# Patient Record
Sex: Male | Born: 1960 | Race: White | Hispanic: No | Smoking: Former smoker
Health system: Southern US, Community
[De-identification: ages and names within clinical notes are randomized; demographics above are authoritative.]

## PROBLEM LIST (undated history)

## (undated) DIAGNOSIS — I10 Essential (primary) hypertension: Secondary | ICD-10-CM

## (undated) DIAGNOSIS — S270XXA Traumatic pneumothorax, initial encounter: Secondary | ICD-10-CM

## (undated) DIAGNOSIS — K5792 Diverticulitis of intestine, part unspecified, without perforation or abscess without bleeding: Secondary | ICD-10-CM

## (undated) DIAGNOSIS — S32029B Unspecified fracture of second lumbar vertebra, initial encounter for open fracture: Secondary | ICD-10-CM

## (undated) HISTORY — DX: Unspecified fracture of second lumbar vertebra, initial encounter for open fracture: S32.029B

## (undated) HISTORY — PX: LIPOMA EXCISION: SHX5283

## (undated) HISTORY — PX: BACK SURGERY: SHX140

## (undated) HISTORY — DX: Traumatic pneumothorax, initial encounter: S27.0XXA

---

## 1999-07-03 ENCOUNTER — Encounter: Payer: Self-pay | Admitting: Emergency Medicine

## 1999-07-03 ENCOUNTER — Emergency Department (HOSPITAL_COMMUNITY): Admission: EM | Admit: 1999-07-03 | Discharge: 1999-07-03 | Payer: Self-pay

## 1999-07-20 ENCOUNTER — Ambulatory Visit (HOSPITAL_BASED_OUTPATIENT_CLINIC_OR_DEPARTMENT_OTHER): Admission: RE | Admit: 1999-07-20 | Discharge: 1999-07-20 | Payer: Self-pay | Admitting: Orthopaedic Surgery

## 2004-04-27 ENCOUNTER — Ambulatory Visit (HOSPITAL_COMMUNITY): Admission: RE | Admit: 2004-04-27 | Discharge: 2004-04-27 | Payer: Self-pay | Admitting: Dermatology

## 2006-08-17 ENCOUNTER — Ambulatory Visit (HOSPITAL_COMMUNITY): Admission: RE | Admit: 2006-08-17 | Discharge: 2006-08-17 | Payer: Self-pay | Admitting: Dermatology

## 2009-01-10 HISTORY — PX: ANKLE SURGERY: SHX546

## 2009-01-10 HISTORY — PX: FOOT SURGERY: SHX648

## 2009-08-10 DIAGNOSIS — S270XXA Traumatic pneumothorax, initial encounter: Secondary | ICD-10-CM

## 2009-08-10 DIAGNOSIS — S32029B Unspecified fracture of second lumbar vertebra, initial encounter for open fracture: Secondary | ICD-10-CM

## 2009-08-10 HISTORY — DX: Traumatic pneumothorax, initial encounter: S27.0XXA

## 2009-08-10 HISTORY — DX: Unspecified fracture of second lumbar vertebra, initial encounter for open fracture: S32.029B

## 2009-09-18 ENCOUNTER — Encounter: Admission: RE | Admit: 2009-09-18 | Discharge: 2009-09-18 | Payer: Self-pay | Admitting: Orthopedic Surgery

## 2009-12-17 ENCOUNTER — Emergency Department (HOSPITAL_COMMUNITY): Admission: EM | Admit: 2009-12-17 | Discharge: 2009-09-27 | Payer: Self-pay | Admitting: Emergency Medicine

## 2010-02-16 ENCOUNTER — Other Ambulatory Visit: Payer: Self-pay | Admitting: General Surgery

## 2010-02-16 ENCOUNTER — Ambulatory Visit (HOSPITAL_COMMUNITY): Admit: 2010-02-16 | Payer: Self-pay | Admitting: General Surgery

## 2010-02-16 ENCOUNTER — Ambulatory Visit (HOSPITAL_COMMUNITY)
Admission: RE | Admit: 2010-02-16 | Discharge: 2010-02-16 | Disposition: A | Discharge status: Home / Self Care | Payer: PRIVATE HEALTH INSURANCE | Source: Ambulatory Visit | Attending: General Surgery | Admitting: General Surgery

## 2010-02-16 DIAGNOSIS — Z01812 Encounter for preprocedural laboratory examination: Secondary | ICD-10-CM | POA: Insufficient documentation

## 2010-02-16 DIAGNOSIS — D1739 Benign lipomatous neoplasm of skin and subcutaneous tissue of other sites: Secondary | ICD-10-CM | POA: Insufficient documentation

## 2010-02-16 DIAGNOSIS — Z01810 Encounter for preprocedural cardiovascular examination: Secondary | ICD-10-CM | POA: Insufficient documentation

## 2010-02-16 LAB — DIFFERENTIAL
Basophils Absolute: 0 10*3/uL (ref 0.0–0.1)
Basophils Relative: 0 % (ref 0–1)
Eosinophils Absolute: 0.1 10*3/uL (ref 0.0–0.7)
Eosinophils Relative: 1 % (ref 0–5)
Monocytes Absolute: 0.7 10*3/uL (ref 0.1–1.0)

## 2010-02-16 LAB — CBC
MCHC: 33 g/dL (ref 30.0–36.0)
Platelets: 260 10*3/uL (ref 150–400)
RDW: 13.6 % (ref 11.5–15.5)
WBC: 7.9 10*3/uL (ref 4.0–10.5)

## 2010-02-16 LAB — COMPREHENSIVE METABOLIC PANEL
ALT: 44 U/L (ref 0–53)
Albumin: 3.8 g/dL (ref 3.5–5.2)
Alkaline Phosphatase: 88 U/L (ref 39–117)
Calcium: 9.3 mg/dL (ref 8.4–10.5)
Potassium: 4.6 mEq/L (ref 3.5–5.1)
Sodium: 140 mEq/L (ref 135–145)
Total Protein: 7 g/dL (ref 6.0–8.3)

## 2010-02-16 LAB — SURGICAL PCR SCREEN: MRSA, PCR: NEGATIVE

## 2010-03-01 NOTE — Op Note (Signed)
NAMEEBRIMA, RANTA NO.:  [id number]  MEDICAL RECORD NO.:  [id number]           PATIENT TYPE:  O  LOCATION:  [facility]                         FACILITY:  [facility]  PHYSICIAN:  Mary Sella. Andrey Campanile, MD     DATE OF BIRTH:  09/15/1960  DATE OF PROCEDURE:  02/16/2010 DATE OF DISCHARGE:                              OPERATIVE REPORT   PREOPERATIVE DIAGNOSIS:  Right upper back subcutaneous mass.  POSTOPERATIVE DIAGNOSIS:  Right upper back lipoma.  PROCEDURE:  Excision of right upper back lipoma with a 2-layered closure.  ANESTHESIA:  General plus 30 mL of 0.25% Marcaine with epi.  SPECIMENS:  Right upper back lipoma which was sent to pathology.  ESTIMATED BLOOD LOSS:  Minimal.  INDICATIONS FOR PROCEDURE:  The patient is very pleasant 50 year old gentleman who has had a 10-year history of a mass in his right upper back.  For the past 6 to 7 months, this has been bothering him.  We discussed the risks and benefits of surgery including bleeding, infection, injury to surrounding structure; wound complications such as hematoma, seroma and infection.  We also talked about the risk of DVT occurrence and also talked about the remote possibility and need for additional procedures should this be found to have some form of soft tissue cancer.  We also talked about the typical recovery.  DESCRIPTION OF PROCEDURE:  After obtaining informed consent, the patient was brought to the operating room where general endotracheal anesthesia was established.  He was then placed in the prone position with the appropriate padding.  Sequential compression devices had been placed. His back was prepped and draped in usual standard surgical fashion.  A surgical time-out was performed.  He received 2 g of Ancef prior to skin incision.  The operative site had been confirmed in the holding area with the patient confirming the operative site.  I then made a transverse 7-cm incision with a #15 blade.  The  subcutaneous tissue and deep dermis were divided with electrocautery.  Then, using skin hooks, I created skin flaps both superior and inferiorly as I was unable to get my finger up underneath the lipoma.  I then separated it from its fiber strands using both blunt dissection as well as electrocautery. Eventually, I was able to free the lipoma in its entirety.  It was passed off the field.  I then inspected the cavity.  There was no evidence of any remaining lipoma.  Hemostasis was achieved with electrocautery.  The wound was irrigated.  There was no evidence of bleeding.  Local was infiltrated underneath the muscular fascia.  It should be noted that the lipoma did go down to the fascia.  I then also put some local in the deep dermis.  The deep dermis was then reapproximated with inverted interrupted 3-0 Vicryl, and the skin was reapproximated with a 4-0 Monocryl in a subcuticular fashion followed by Dermabond.  The patient was then placed in the supine position on the stretcher.  He was extubated.  Needle, instrument, and sponge counts were correct x2. There were no immediate complications.  The patient tolerated the procedure well.  Mary Sella. Andrey Campanile, MD     EMW/MEDQ  D:  02/16/2010  T:  02/17/2010  Job:  [id number]  Electronically Signed by Gaynelle Adu M.D. on 03/01/2010 04:54:09 AM

## 2010-06-15 ENCOUNTER — Telehealth: Payer: Self-pay | Admitting: Internal Medicine

## 2010-06-15 NOTE — Telephone Encounter (Signed)
Do not recall any paperwork - that was the question being asked?

## 2010-06-15 NOTE — Telephone Encounter (Signed)
LMOMTCB- will have her refax records for MW to review.

## 2010-06-15 NOTE — Telephone Encounter (Signed)
Called, spoke with Dawn.  States she sent over records on this patient last week.  She would like to know if MW received the records.  If so, would like to know if he has any questions regarding this and understands what she is asking.  Dr. Sherene Sires, have you seen this?  Pls advise.  Thanks!

## 2010-06-16 NOTE — Telephone Encounter (Signed)
[email or web address] 

## 2010-06-17 ENCOUNTER — Telehealth: Payer: Self-pay | Admitting: *Deleted

## 2010-06-17 NOTE — Telephone Encounter (Signed)
See 06/17/10 phone note.

## 2010-06-17 NOTE — Telephone Encounter (Signed)
LMOM for Jeff Dillon advising her that we have not received the records and she should fax them to [phone] and that no call back here is needed.

## 2010-07-01 ENCOUNTER — Telehealth: Payer: Self-pay | Admitting: Internal Medicine

## 2010-07-01 NOTE — Telephone Encounter (Signed)
Called, spoke with Dawn.  She would like to know if MW ever received the medical records she faxed on this pt to see about a second opinion.  Pls advise. Thanks!

## 2010-07-01 NOTE — Telephone Encounter (Signed)
I don't recall seeing any and I can't find where I've established a pt - physician relationship with this pt based on emr review so I won't be able to comment on paperwork if even if I see it.  If pt is wanting to see me, I'm happy to see self referrals for pulmonary problems.

## 2010-07-02 NOTE — Telephone Encounter (Signed)
Called, spoke with Dawn.  She is aware MW has not seen this paperwork but will need to establish with pt in order to comment on them.  Advised he will be willing to see pt for pulmonary problems as self referral.  She verbalized understanding of this and requesting to schedule consult for pt.  Consult scheduled for July 31 at 9:15am for continuing pain/discomfort at old chest tube site from pneumothorax -- instructed dawn pt will need to arrive 15 min early and will need to bring all current meds with her.  She verbalized understanding of this and will inform pt.  Also, she will bring pt's records to office before this appt for MW's review and she has tried to fax them twice.

## 2010-07-02 NOTE — Telephone Encounter (Signed)
LMOMTCB

## 2010-07-02 NOTE — Telephone Encounter (Signed)
Research returning call says it's ok to lmom.Jeff Dillon

## 2010-07-30 ENCOUNTER — Telehealth: Payer: Self-pay | Admitting: Internal Medicine

## 2010-08-04 NOTE — Telephone Encounter (Signed)
noted 

## 2010-08-09 ENCOUNTER — Encounter: Payer: Self-pay | Admitting: Internal Medicine

## 2010-08-10 ENCOUNTER — Encounter: Payer: Self-pay | Admitting: Internal Medicine

## 2010-08-10 ENCOUNTER — Ambulatory Visit (INDEPENDENT_AMBULATORY_CARE_PROVIDER_SITE_OTHER): Payer: Self-pay | Admitting: Internal Medicine

## 2010-08-10 VITALS — BP 130/78 | HR 74 | Temp 97.9°F | Ht 72.0 in | Wt 259.0 lb

## 2010-08-10 DIAGNOSIS — R071 Chest pain on breathing: Secondary | ICD-10-CM

## 2010-08-10 DIAGNOSIS — R0789 Other chest pain: Secondary | ICD-10-CM
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
## 2010-08-10 NOTE — Patient Instructions (Signed)
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  No further workup is needed.  If it worsens you need a CT Chest scan   To get the most out of exercise, you need to be continuously aware that you are short of breath, but never out of breath, for 30 minutes daily. As you improve, it will actually be easier for you to do the same amount of exercise  in  30 minutes so always push to the level where you are short of breath.

## 2010-08-11 ENCOUNTER — Encounter: Payer: Self-pay | Admitting: Internal Medicine

## 2010-08-11 DIAGNOSIS — R0789 Other chest pain: Secondary | ICD-10-CM | POA: Insufficient documentation

## 2010-08-11 NOTE — Assessment & Plan Note (Signed)
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  No evidence of pleural effuson or sign pleural scar/ restrictive process clinically though pft's not done and ct chest would be needed to be more certain but I don't feel either are indicated based on today's eval  See instructions for specific recommendations which were reviewed directly with the patient who was given a copy with highlighter outlining the key components.

## 2013-08-19 ENCOUNTER — Other Ambulatory Visit: Payer: Self-pay | Admitting: Dermatology

## 2018-03-11 DIAGNOSIS — K5792 Diverticulitis of intestine, part unspecified, without perforation or abscess without bleeding: Secondary | ICD-10-CM

## 2018-03-11 HISTORY — DX: Diverticulitis of intestine, part unspecified, without perforation or abscess without bleeding: K57.92

## 2018-03-20 ENCOUNTER — Emergency Department (HOSPITAL_COMMUNITY): Payer: Self-pay

## 2018-03-20 ENCOUNTER — Inpatient Hospital Stay (HOSPITAL_COMMUNITY)
Admission: EM | Admit: 2018-03-20 | Discharge: 2018-03-26 | DRG: 392 | Disposition: A | Discharge status: Home / Self Care | Payer: Self-pay | Attending: Surgery | Admitting: Surgery

## 2018-03-20 ENCOUNTER — Encounter (HOSPITAL_COMMUNITY): Payer: Self-pay | Admitting: Emergency Medicine

## 2018-03-20 ENCOUNTER — Other Ambulatory Visit: Payer: Self-pay

## 2018-03-20 DIAGNOSIS — K5732 Diverticulitis of large intestine without perforation or abscess without bleeding: Principal | ICD-10-CM | POA: Diagnosis present

## 2018-03-20 DIAGNOSIS — Z885 Allergy status to narcotic agent status: Secondary | ICD-10-CM

## 2018-03-20 DIAGNOSIS — R112 Nausea with vomiting, unspecified: Secondary | ICD-10-CM | POA: Diagnosis present

## 2018-03-20 DIAGNOSIS — R12 Heartburn: Secondary | ICD-10-CM | POA: Diagnosis present

## 2018-03-20 DIAGNOSIS — K5720 Diverticulitis of large intestine with perforation and abscess without bleeding: Secondary | ICD-10-CM

## 2018-03-20 DIAGNOSIS — Z87891 Personal history of nicotine dependence: Secondary | ICD-10-CM

## 2018-03-20 HISTORY — DX: Diverticulitis of intestine, part unspecified, without perforation or abscess without bleeding: K57.92

## 2018-03-20 HISTORY — DX: Essential (primary) hypertension: I10

## 2018-03-20 LAB — URINALYSIS, ROUTINE W REFLEX MICROSCOPIC
BACTERIA UA: NONE SEEN
BILIRUBIN URINE: NEGATIVE
Glucose, UA: NEGATIVE mg/dL
Ketones, ur: 5 mg/dL — AB
LEUKOCYTE UA: NEGATIVE
NITRITE: NEGATIVE
PROTEIN: NEGATIVE mg/dL
Specific Gravity, Urine: 1.025 (ref 1.005–1.030)
pH: 5 (ref 5.0–8.0)

## 2018-03-20 LAB — CBC
HCT: 39.2 % (ref 39.0–52.0)
HCT: 43.7 % (ref 39.0–52.0)
Hemoglobin: 12.7 g/dL — ABNORMAL LOW (ref 13.0–17.0)
Hemoglobin: 13.9 g/dL (ref 13.0–17.0)
MCH: 27.3 pg (ref 26.0–34.0)
MCH: 28.2 pg (ref 26.0–34.0)
MCHC: 31.8 g/dL (ref 30.0–36.0)
MCHC: 32.4 g/dL (ref 30.0–36.0)
MCV: 85.7 fL (ref 80.0–100.0)
MCV: 86.9 fL (ref 80.0–100.0)
NRBC: 0 % (ref 0.0–0.2)
PLATELETS: 392 10*3/uL (ref 150–400)
Platelets: 325 10*3/uL (ref 150–400)
RBC: 4.51 MIL/uL (ref 4.22–5.81)
RBC: 5.1 MIL/uL (ref 4.22–5.81)
RDW: 13.1 % (ref 11.5–15.5)
RDW: 13.4 % (ref 11.5–15.5)
WBC: 18.9 10*3/uL — AB (ref 4.0–10.5)
WBC: 25.1 10*3/uL — ABNORMAL HIGH (ref 4.0–10.5)
nRBC: 0 % (ref 0.0–0.2)

## 2018-03-20 LAB — COMPREHENSIVE METABOLIC PANEL
ALK PHOS: 79 U/L (ref 38–126)
ALT: 29 U/L (ref 0–44)
AST: 25 U/L (ref 15–41)
Albumin: 4.1 g/dL (ref 3.5–5.0)
Anion gap: 13 (ref 5–15)
BILIRUBIN TOTAL: 0.9 mg/dL (ref 0.3–1.2)
BUN: 13 mg/dL (ref 6–20)
CALCIUM: 9 mg/dL (ref 8.9–10.3)
CO2: 21 mmol/L — ABNORMAL LOW (ref 22–32)
CREATININE: 1 mg/dL (ref 0.61–1.24)
Chloride: 101 mmol/L (ref 98–111)
Glucose, Bld: 155 mg/dL — ABNORMAL HIGH (ref 70–99)
Potassium: 3.9 mmol/L (ref 3.5–5.1)
Sodium: 135 mmol/L (ref 135–145)
TOTAL PROTEIN: 7.7 g/dL (ref 6.5–8.1)

## 2018-03-20 LAB — CREATININE, SERUM
Creatinine, Ser: 0.91 mg/dL (ref 0.61–1.24)
GFR calc Af Amer: >60 mL/min (ref >60)
GFR calc non Af Amer: >60 mL/min (ref >60)

## 2018-03-20 LAB — HIV ANTIBODY (ROUTINE TESTING W REFLEX): HIV Screen 4th Generation wRfx: NONREACTIVE

## 2018-03-20 LAB — LIPASE, BLOOD: LIPASE: 17 U/L (ref 11–51)

## 2018-03-20 MED ORDER — PIPERACILLIN-TAZOBACTAM 3.375 G IVPB 30 MIN
3.3750 g | Freq: Once | INTRAVENOUS | Status: AC
  Administered 2018-03-20: 3.375 g via INTRAVENOUS
  Filled 2018-03-20: qty 50

## 2018-03-20 MED ORDER — METRONIDAZOLE IN NACL 5-0.79 MG/ML-% IV SOLN
500.0000 mg | Freq: Once | INTRAVENOUS | Status: AC
  Administered 2018-03-20: 500 mg via INTRAVENOUS
  Filled 2018-03-20: qty 100

## 2018-03-20 MED ORDER — AMOXICILLIN-POT CLAVULANATE 875-125 MG PO TABS: 1.0000 | ORAL_TABLET | Freq: Once | ORAL | Status: DC

## 2018-03-20 MED ORDER — ONDANSETRON HCL 4 MG/2ML IJ SOLN
4.0000 mg | Freq: Once | INTRAMUSCULAR | Status: AC
  Administered 2018-03-20: 4 mg via INTRAVENOUS
  Filled 2018-03-20: qty 2

## 2018-03-20 MED ORDER — OXYCODONE HCL 5 MG PO TABS
5.0000 mg | ORAL_TABLET | ORAL | Status: DC | PRN
  Administered 2018-03-20 – 2018-03-22 (×5): 5 mg via ORAL
  Filled 2018-03-20 (×5): qty 1

## 2018-03-20 MED ORDER — METOPROLOL TARTRATE 5 MG/5ML IV SOLN
5.0000 mg | Freq: Four times a day (QID) | INTRAVENOUS | Status: AC | PRN
  Administered 2018-03-22 – 2018-03-24 (×4): 5 mg via INTRAVENOUS
  Filled 2018-03-20 (×4): qty 5

## 2018-03-20 MED ORDER — ONDANSETRON HCL 4 MG/2ML IJ SOLN
4.0000 mg | Freq: Four times a day (QID) | INTRAMUSCULAR | Status: DC | PRN
  Administered 2018-03-23: 4 mg via INTRAVENOUS
  Filled 2018-03-20 (×2): qty 2

## 2018-03-20 MED ORDER — ACETAMINOPHEN 325 MG PO TABS
650.0000 mg | ORAL_TABLET | Freq: Four times a day (QID) | ORAL | Status: DC | PRN
  Administered 2018-03-24 – 2018-03-25 (×2): 650 mg via ORAL
  Filled 2018-03-20 (×2): qty 2

## 2018-03-20 MED ORDER — MORPHINE SULFATE (PF) 4 MG/ML IV SOLN
4.0000 mg | Freq: Once | INTRAVENOUS | Status: AC
  Administered 2018-03-20: 4 mg via INTRAVENOUS
  Filled 2018-03-20: qty 1

## 2018-03-20 MED ORDER — SODIUM CHLORIDE 0.9 % IV BOLUS
1000.0000 mL | Freq: Once | INTRAVENOUS | Status: AC
  Administered 2018-03-20: 1000 mL via INTRAVENOUS

## 2018-03-20 MED ORDER — MORPHINE SULFATE (PF) 2 MG/ML IV SOLN
2.0000 mg | INTRAVENOUS | Status: DC | PRN
  Administered 2018-03-20 (×2): 2 mg via INTRAVENOUS
  Filled 2018-03-20 (×3): qty 1

## 2018-03-20 MED ORDER — PIPERACILLIN-TAZOBACTAM 3.375 G IVPB
3.3750 g | Freq: Three times a day (TID) | INTRAVENOUS | Status: DC
  Administered 2018-03-20 – 2018-03-26 (×18): 3.375 g via INTRAVENOUS
  Filled 2018-03-20 (×16): qty 50

## 2018-03-20 MED ORDER — DIPHENHYDRAMINE HCL 50 MG/ML IJ SOLN: 25.0000 mg | Freq: Four times a day (QID) | INTRAMUSCULAR | Status: DC | PRN

## 2018-03-20 MED ORDER — METOPROLOL TARTRATE 5 MG/5ML IV SOLN: 5.0000 mg | Freq: Four times a day (QID) | INTRAVENOUS | Status: DC | PRN

## 2018-03-20 MED ORDER — HYDROMORPHONE HCL 1 MG/ML IJ SOLN
1.0000 mg | Freq: Once | INTRAMUSCULAR | Status: AC
  Administered 2018-03-20: 1 mg via INTRAVENOUS
  Filled 2018-03-20: qty 1

## 2018-03-20 MED ORDER — CIPROFLOXACIN IN D5W 400 MG/200ML IV SOLN
400.0000 mg | Freq: Once | INTRAVENOUS | Status: AC
  Administered 2018-03-20: 400 mg via INTRAVENOUS
  Filled 2018-03-20: qty 200

## 2018-03-20 MED ORDER — ENOXAPARIN SODIUM 40 MG/0.4ML ~~LOC~~ SOLN
40.0000 mg | SUBCUTANEOUS | Status: DC
  Administered 2018-03-20 – 2018-03-25 (×6): 40 mg via SUBCUTANEOUS
  Filled 2018-03-20 (×8): qty 0.4

## 2018-03-20 MED ORDER — ONDANSETRON 4 MG PO TBDP: 4.0000 mg | ORAL_TABLET | Freq: Four times a day (QID) | ORAL | Status: DC | PRN

## 2018-03-20 MED ORDER — IOHEXOL 300 MG/ML  SOLN
100.0000 mL | Freq: Once | INTRAMUSCULAR | Status: AC | PRN
  Administered 2018-03-20: 100 mL via INTRAVENOUS

## 2018-03-20 MED ORDER — DEXTROSE-NACL 5-0.45 % IV SOLN
INTRAVENOUS | Status: DC
  Administered 2018-03-20: 20:00:00 via INTRAVENOUS
  Administered 2018-03-20: 100 mL/h via INTRAVENOUS
  Administered 2018-03-21 – 2018-03-23 (×6): via INTRAVENOUS

## 2018-03-20 MED ORDER — ACETAMINOPHEN 650 MG RE SUPP: 650.0000 mg | Freq: Four times a day (QID) | RECTAL | Status: DC | PRN

## 2018-03-20 MED ORDER — DIPHENHYDRAMINE HCL 25 MG PO CAPS: 25.0000 mg | ORAL_CAPSULE | Freq: Four times a day (QID) | ORAL | Status: DC | PRN

## 2018-03-20 MED ORDER — SODIUM CHLORIDE 0.9% FLUSH: 3.0000 mL | Freq: Once | INTRAVENOUS | Status: DC
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
## 2018-03-20 NOTE — ED Notes (Signed)
Surgical pa contacted and state no new orders for BP but change in diet.

## 2018-03-20 NOTE — ED Triage Notes (Signed)
Pt BIB [facility], c/o lower abdominal pain x months, worsening tonight. Denies vomiting/diarrhea or urinary changes. States pain radiates to testicles when most severe.
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
## 2018-03-21 ENCOUNTER — Encounter (HOSPITAL_COMMUNITY): Payer: Self-pay | Admitting: General Practice

## 2018-03-21 ENCOUNTER — Other Ambulatory Visit: Payer: Self-pay

## 2018-03-21 LAB — COMPREHENSIVE METABOLIC PANEL
ALT: 20 U/L (ref 0–44)
AST: 15 U/L (ref 15–41)
Albumin: 3.2 g/dL — ABNORMAL LOW (ref 3.5–5.0)
Alkaline Phosphatase: 70 U/L (ref 38–126)
Anion gap: 9 (ref 5–15)
BUN: 8 mg/dL (ref 6–20)
CALCIUM: 8.5 mg/dL — AB (ref 8.9–10.3)
CO2: 28 mmol/L (ref 22–32)
Chloride: 96 mmol/L — ABNORMAL LOW (ref 98–111)
Creatinine, Ser: 1.07 mg/dL (ref 0.61–1.24)
GFR calc Af Amer: >60 mL/min (ref >60)
GFR calc non Af Amer: >60 mL/min (ref >60)
Glucose, Bld: 140 mg/dL — ABNORMAL HIGH (ref 70–99)
Potassium: 3.7 mmol/L (ref 3.5–5.1)
Sodium: 133 mmol/L — ABNORMAL LOW (ref 135–145)
Total Bilirubin: 1.3 mg/dL — ABNORMAL HIGH (ref 0.3–1.2)
Total Protein: 6.9 g/dL (ref 6.5–8.1)

## 2018-03-21 LAB — CBC
HCT: 37.2 % — ABNORMAL LOW (ref 39.0–52.0)
Hemoglobin: 12.1 g/dL — ABNORMAL LOW (ref 13.0–17.0)
MCH: 28 pg (ref 26.0–34.0)
MCHC: 32.5 g/dL (ref 30.0–36.0)
MCV: 86.1 fL (ref 80.0–100.0)
PLATELETS: 276 10*3/uL (ref 150–400)
RBC: 4.32 MIL/uL (ref 4.22–5.81)
RDW: 13.3 % (ref 11.5–15.5)
WBC: 18 10*3/uL — ABNORMAL HIGH (ref 4.0–10.5)
nRBC: 0 % (ref 0.0–0.2)

## 2018-03-21 MED ORDER — POLYETHYLENE GLYCOL 3350 17 G PO PACK
17.0000 g | PACK | Freq: Every day | ORAL | Status: DC
  Administered 2018-03-21 – 2018-03-24 (×3): 17 g via ORAL
  Filled 2018-03-21 (×4): qty 1

## 2018-03-21 MED ORDER — DOCUSATE SODIUM 100 MG PO CAPS
100.0000 mg | ORAL_CAPSULE | Freq: Two times a day (BID) | ORAL | Status: DC
  Administered 2018-03-21 – 2018-03-24 (×5): 100 mg via ORAL
  Filled 2018-03-21 (×6): qty 1

## 2018-03-21 MED ORDER — HYDROMORPHONE HCL 1 MG/ML IJ SOLN
1.0000 mg | INTRAMUSCULAR | Status: DC | PRN
  Administered 2018-03-21 – 2018-03-23 (×13): 1 mg via INTRAVENOUS
  Filled 2018-03-21 (×13): qty 1

## 2018-03-21 NOTE — Progress Notes (Deleted)
Wrong entry

## 2018-03-21 NOTE — TOC Initial Note (Signed)
Transition of Care [facility]) - Initial/Assessment Note    Patient Details  Name: Jeff Dillon MRN: [id number] Date of Birth: 1960-01-17  Transition of Care [facility]) CM/SW Contact:    Marilu Favre, RN Phone Number: 03/21/2018, 12:40 PM  Clinical Narrative:                  Confirmed face sheet information with patient.  Provided [facility] information. Patient stated he will call to schedule an appointment.   [employer] medication assistance [place] letter. Will continue to follow. Expected Discharge Plan: Home/Self Care Barriers to Discharge: No Barriers Identified   Patient Goals and CMS Choice Patient states their goals for this hospitalization and ongoing recovery are:: to return home / work [employer].gov Compare Post Acute Care list provided to:: Patient Choice offered to / list presented to : Patient  Expected Discharge Plan and Services Expected Discharge Plan: Home/Self Care Discharge Planning Services: CM Consult, Medication Assistance, [place], [facility] Post Acute Care Choice: NA Living arrangements for the past 2 months: Single Family Home                 DME Arranged: N/A DME Agency: NA HH Arranged: NA HH Agency: NA  Prior Living Arrangements/Services Living arrangements for the past 2 months: Single Family Home Lives with:: Self Patient language and need for interpreter reviewed:: Yes Do you feel safe going back to the place where you live?: Yes      Need for Family Participation in Patient Care: No (Comment) Care giver support system in place?: Yes (comment)   Criminal Activity/Legal Involvement Pertinent to Current Situation/Hospitalization: No - Comment as needed  Activities of Daily Living Home Assistive Devices/Equipment: Eyeglasses ADL Screening (condition at time of admission) Patient's cognitive ability adequate to safely complete daily activities?: Yes Is the patient deaf or have difficulty hearing?: No Does the patient  have difficulty seeing, even when wearing glasses/contacts?: No Does the patient have difficulty concentrating, remembering, or making decisions?: No Patient able to express need for assistance with ADLs?: Yes Does the patient have difficulty dressing or bathing?: No Independently performs ADLs?: Yes (appropriate for developmental age) Does the patient have difficulty walking or climbing stairs?: No Weakness of Legs: None Weakness of Arms/Hands: None  Permission Sought/Granted                  Emotional Assessment Appearance:: Appears stated age Attitude/Demeanor/Rapport: Engaged Affect (typically observed): Accepting Orientation: : Oriented to Self, Oriented to Place, Oriented to  Time, Oriented to Situation   Psych Involvement: No (comment)  Admission diagnosis:  Diverticulitis of large intestine with perforation without abscess, unspecified bleeding status [K57.20] Patient Active Problem List   Diagnosis Date Noted  . Diverticulitis large intestine 03/20/2018  . Chest wall pain 08/11/2010   PCP:  Patient, No Pcp Per Pharmacy:   [id number] - [place], [place] [place] [address] Phone: [phone] Fax: [phone]  [place] [address] [facility], [place] [address] Phone: [phone] Fax: [phone]     Social Determinants of Health (SDOH) Interventions    Readmission Risk Interventions 30 Day Unplanned Readmission Risk Score     ED to Hosp-Admission (Current) from 03/20/2018 in [facility]  30 Day Unplanned Readmission Risk Score (%)  9 Filed at 03/21/2018 1200     This score is the patient's risk of an unplanned  readmission within 30 days of being discharged (0 -100%). The score is based on dignosis, age, lab data, medications, orders, and past utilization.   Low:  0-14.9   Medium: 15-21.9   High: 22-29.9   Extreme: 30 and  above       No flowsheet data found.

## 2018-03-21 NOTE — Plan of Care (Signed)
  Problem: Education: Goal: Knowledge of General Education information will improve Description Including pain rating scale, medication(s)/side effects and non-pharmacologic comfort measures Outcome: Progressing   Problem: Health Behavior/Discharge Planning: Goal: Ability to manage health-related needs will improve Outcome: Progressing   Problem: Clinical Measurements: Goal: Ability to maintain clinical measurements within normal limits will improve Outcome: Progressing Goal: Will remain free from infection Outcome: Progressing   Problem: Activity: Goal: Risk for activity intolerance will decrease Outcome: Progressing   Problem: Coping: Goal: Level of anxiety will decrease Outcome: Progressing   Problem: Elimination: Goal: Will not experience complications related to urinary retention Outcome: Progressing   Problem: Pain Managment: Goal: General experience of comfort will improve Outcome: Progressing   Problem: Safety: Goal: Ability to remain free from injury will improve Outcome: Progressing   Problem: Skin Integrity: Goal: Risk for impaired skin integrity will decrease Outcome: Progressing   

## 2018-03-21 NOTE — Progress Notes (Signed)
[facility] Progress Note     Subjective: CC: abdominal pain Patient reports more severe pain in RLQ this AM, feels sharp and cramping. Denies nausea or vomiting. No flatus or BM.   Objective: Vital signs in last 24 hours: Temp:  [98.6 F (37 C)-99.1 F (37.3 C)] 98.6 F (37 C) (03/11 0438) Pulse Rate:  [79-99] 96 (03/11 0438) Resp:  [16-19] 19 (03/11 0438) BP: (143-181)/(85-110) 145/90 (03/11 0438) SpO2:  [93 %-100 %] 100 % (03/11 0438)    Intake/Output from previous day: 03/10 0701 - 03/11 0700 In: 1518.5 [I.V.:395.1; IV Piggyback:1123.4] Out: 200 [Urine:200] Intake/Output this shift: Total I/O In: 0  Out: 30 [Urine:30]  PE: Gen:  Alert, in obvious discomfort Card:  Regular rate and rhythm Pulm:  Normal effort, clear to auscultation bilaterally Abd: Soft, TTP in RLQ, distended, +BS Skin: warm and dry, no rashes  Psych: A&Ox3   Lab Results:  Recent Labs    03/20/18 0754 03/21/18 0222  WBC 25.1* 18.0*  HGB 12.7* 12.1*  HCT 39.2 37.2*  PLT 325 276   BMET Recent Labs    03/20/18 0015 03/20/18 0754 03/21/18 0222  NA 135  --  133*  K 3.9  --  3.7  CL 101  --  96*  CO2 21*  --  28  GLUCOSE 155*  --  140*  BUN 13  --  8  CREATININE 1.00 0.91 1.07  CALCIUM 9.0  --  8.5*   PT/INR No results for input(s): LABPROT, INR in the last 72 hours. CMP     Component Value Date/Time   NA 133 (L) 03/21/2018 0222   K 3.7 03/21/2018 0222   CL 96 (L) 03/21/2018 0222   CO2 28 03/21/2018 0222   GLUCOSE 140 (H) 03/21/2018 0222   BUN 8 03/21/2018 0222   CREATININE 1.07 03/21/2018 0222   CALCIUM 8.5 (L) 03/21/2018 0222   PROT 6.9 03/21/2018 0222   ALBUMIN 3.2 (L) 03/21/2018 0222   AST 15 03/21/2018 0222   ALT 20 03/21/2018 0222   ALKPHOS 70 03/21/2018 0222   BILITOT 1.3 (H) 03/21/2018 0222   GFRNONAA >60 03/21/2018 0222   GFRAA >60 03/21/2018 0222   Lipase     Component Value Date/Time   LIPASE 17 03/20/2018 0015       Studies/Results: Ct  Abdomen Pelvis W Contrast  Result Date: 03/20/2018 CLINICAL DATA:  Lower abdominal pain radiating to testicles. Assess for hernia or appendectomy. EXAM: CT ABDOMEN AND PELVIS WITH CONTRAST TECHNIQUE: Multidetector CT imaging of the abdomen and pelvis was performed using the standard protocol following bolus administration of intravenous contrast. CONTRAST:  154mL OMNIPAQUE IOHEXOL 300 MG/ML  SOLN COMPARISON:  None. FINDINGS: LOWER CHEST: Lung bases are clear. Included heart size is normal. No pericardial effusion. Fat containing paraesophageal hernia. HEPATOBILIARY: Liver and gallbladder are normal. PANCREAS: Normal. SPLEEN: Normal. ADRENALS/URINARY TRACT: Kidneys are orthotopic, demonstrating symmetric enhancement. No nephrolithiasis, hydronephrosis or solid renal masses. Too small to characterize hypodensity LEFT interpolar kidney. The unopacified ureters are normal in course and caliber. Delayed imaging through the kidneys demonstrates symmetric prompt contrast excretion within the proximal urinary collecting system. Urinary bladder is partially distended and unremarkable. Normal adrenal glands. STOMACH/BOWEL: Moderate sigmoid colonic diverticulosis with superimposed short segment of sigmoid colonic bowel wall thickening and pericolonic fat stranding. Contained microperforation without abscess. The adjacent terminal ileum is inflamed with surrounding fat stranding. Normal appendix. Small and large bowel normal course and caliber. VASCULAR/LYMPHATIC: Aortoiliac vessels are normal in course  and caliber. No lymphadenopathy by CT size criteria. REPRODUCTIVE: Normal. OTHER: No intraperitoneal free fluid or free air. MUSCULOSKELETAL: Nonacute. Small bilateral fat containing inguinal hernias. Severe L5-S1 degenerative disc. IMPRESSION: 1. Acute sigmoid colonic diverticulitis. Adjacent terminal ileal inflammation is likely reactive. No abscess or obstruction. Electronically Signed   By: Elon Alas M.D.   On:  03/20/2018 05:43    Anti-infectives: Anti-infectives (From admission, onward)   Start     Dose/Rate Route Frequency Ordered Stop   03/20/18 1400  piperacillin-tazobactam (ZOSYN) IVPB 3.375 g     3.375 g 12.5 mL/hr over 240 Minutes Intravenous Every 8 hours 03/20/18 0643     03/20/18 0715  piperacillin-tazobactam (ZOSYN) IVPB 3.375 g     3.375 g 100 mL/hr over 30 Minutes Intravenous  Once 03/20/18 0702 03/20/18 1037   03/20/18 0615  ciprofloxacin (CIPRO) IVPB 400 mg     400 mg 200 mL/hr over 60 Minutes Intravenous  Once 03/20/18 3704 03/20/18 0959   03/20/18 0615  metroNIDAZOLE (FLAGYL) IVPB 500 mg     500 mg 100 mL/hr over 60 Minutes Intravenous  Once 03/20/18 8889 03/20/18 0837   03/20/18 0600  amoxicillin-clavulanate (AUGMENTIN) 875-125 MG per tablet 1 tablet  Status:  Discontinued     1 tablet Oral  Once 03/20/18 0548 03/20/18 0612       Assessment/Plan Diverticulitis of sigmoid colon - WBC 18, afebrile - more pain in RLQ today - keep npo except chips/sips - continue IV abx - mobilize as tolerated  FEN: ice chips, IVF VTE: SCDs, lovenox ID: cipro/flagyl 3/10; Zosyn 3/10>>  LOS: 1 day    Brigid Re , [facility] Surgery 03/21/2018, 9:43 AM Pager: [place]: [phone]

## 2018-03-22 LAB — COMPREHENSIVE METABOLIC PANEL
ALT: 17 U/L (ref 0–44)
AST: 13 U/L — ABNORMAL LOW (ref 15–41)
Albumin: 2.9 g/dL — ABNORMAL LOW (ref 3.5–5.0)
Alkaline Phosphatase: 59 U/L (ref 38–126)
Anion gap: 10 (ref 5–15)
BUN: 7 mg/dL (ref 6–20)
CO2: 25 mmol/L (ref 22–32)
Calcium: 8.4 mg/dL — ABNORMAL LOW (ref 8.9–10.3)
Chloride: 98 mmol/L (ref 98–111)
Creatinine, Ser: 0.94 mg/dL (ref 0.61–1.24)
GFR calc non Af Amer: >60 mL/min (ref >60)
Glucose, Bld: 135 mg/dL — ABNORMAL HIGH (ref 70–99)
Potassium: 3.4 mmol/L — ABNORMAL LOW (ref 3.5–5.1)
SODIUM: 133 mmol/L — AB (ref 135–145)
Total Bilirubin: 1 mg/dL (ref 0.3–1.2)
Total Protein: 6.7 g/dL (ref 6.5–8.1)

## 2018-03-22 LAB — CBC
HCT: 34.9 % — ABNORMAL LOW (ref 39.0–52.0)
Hemoglobin: 11.5 g/dL — ABNORMAL LOW (ref 13.0–17.0)
MCH: 27.9 pg (ref 26.0–34.0)
MCHC: 33 g/dL (ref 30.0–36.0)
MCV: 84.7 fL (ref 80.0–100.0)
NRBC: 0 % (ref 0.0–0.2)
PLATELETS: 260 10*3/uL (ref 150–400)
RBC: 4.12 MIL/uL — ABNORMAL LOW (ref 4.22–5.81)
RDW: 13.2 % (ref 11.5–15.5)
WBC: 14 10*3/uL — ABNORMAL HIGH (ref 4.0–10.5)

## 2018-03-22 MED ORDER — CALCIUM CARBONATE ANTACID 500 MG PO CHEW
1.0000 | CHEWABLE_TABLET | Freq: Four times a day (QID) | ORAL | Status: DC | PRN
  Administered 2018-03-22 – 2018-03-23 (×2): 200 mg via ORAL
  Filled 2018-03-22 (×2): qty 1

## 2018-03-22 MED ORDER — PANTOPRAZOLE SODIUM 40 MG PO TBEC
40.0000 mg | DELAYED_RELEASE_TABLET | Freq: Every day | ORAL | Status: DC
  Administered 2018-03-22 – 2018-03-26 (×5): 40 mg via ORAL
  Filled 2018-03-22 (×5): qty 1

## 2018-03-22 MED ORDER — PANTOPRAZOLE SODIUM 40 MG IV SOLR
INTRAVENOUS | Status: AC
  Filled 2018-03-22: qty 40

## 2018-03-22 MED ORDER — POTASSIUM CHLORIDE 10 MEQ/100ML IV SOLN
10.0000 meq | INTRAVENOUS | Status: AC
  Administered 2018-03-22 (×3): 10 meq via INTRAVENOUS
  Filled 2018-03-22 (×3): qty 100

## 2018-03-22 NOTE — Plan of Care (Signed)

## 2018-03-22 NOTE — Progress Notes (Signed)
[facility] Progress Note     Subjective: CC-  Patient states that he does not feel great yet but feels better than yesterday. RLQ pain less severe and no longer constant. He had BM x2 yesterday. Some n/v after taking a pain pill, but otherwise no n/v. Tolerating few ice chips. WBC trending down 14 from 18.  Objective: Vital signs in last 24 hours: Temp:  [98.5 F (36.9 C)-99.2 F (37.3 C)] 98.5 F (36.9 C) (03/12 0548) Pulse Rate:  [71-80] 71 (03/12 0548) Resp:  [18-19] 18 (03/12 0548) BP: (134-154)/(76-98) 154/98 (03/12 0548) SpO2:  [97 %-98 %] 97 % (03/12 0548) Last BM Date: 03/20/18  Intake/Output from previous day: 03/11 0701 - 03/12 0700 In: 1241.9 [I.V.:800; IV Piggyback:441.9] Out: 1055 [Urine:1055] Intake/Output this shift: No intake/output data recorded.  PE: Gen:  Alert, NAD, pleasant HEENT: EOM's intact, pupils equal and round Card:  RRR Pulm:  CTAB, no W/R/R, effort normal Abd: slightly firm, protuberant, +BS, TTP RLQ without rebound or guarding Ext:  Calves soft and nontender without edema Psych: A&Ox3  Skin: no rashes noted, warm and dry  Lab Results:  Recent Labs    03/21/18 0222 03/22/18 0158  WBC 18.0* 14.0*  HGB 12.1* 11.5*  HCT 37.2* 34.9*  PLT 276 260   BMET Recent Labs    03/21/18 0222 03/22/18 0158  NA 133* 133*  K 3.7 3.4*  CL 96* 98  CO2 28 25  GLUCOSE 140* 135*  BUN 8 7  CREATININE 1.07 0.94  CALCIUM 8.5* 8.4*   PT/INR No results for input(s): LABPROT, INR in the last 72 hours. CMP     Component Value Date/Time   NA 133 (L) 03/22/2018 0158   K 3.4 (L) 03/22/2018 0158   CL 98 03/22/2018 0158   CO2 25 03/22/2018 0158   GLUCOSE 135 (H) 03/22/2018 0158   BUN 7 03/22/2018 0158   CREATININE 0.94 03/22/2018 0158   CALCIUM 8.4 (L) 03/22/2018 0158   PROT 6.7 03/22/2018 0158   ALBUMIN 2.9 (L) 03/22/2018 0158   AST 13 (L) 03/22/2018 0158   ALT 17 03/22/2018 0158   ALKPHOS 59 03/22/2018 0158   BILITOT 1.0  03/22/2018 0158   GFRNONAA >60 03/22/2018 0158   GFRAA >60 03/22/2018 0158   Lipase     Component Value Date/Time   LIPASE 17 03/20/2018 0015       Studies/Results: No results found.  Anti-infectives: Anti-infectives (From admission, onward)   Start     Dose/Rate Route Frequency Ordered Stop   03/20/18 1400  piperacillin-tazobactam (ZOSYN) IVPB 3.375 g     3.375 g 12.5 mL/hr over 240 Minutes Intravenous Every 8 hours 03/20/18 0643     03/20/18 0715  piperacillin-tazobactam (ZOSYN) IVPB 3.375 g     3.375 g 100 mL/hr over 30 Minutes Intravenous  Once 03/20/18 0702 03/20/18 1037   03/20/18 0615  ciprofloxacin (CIPRO) IVPB 400 mg     400 mg 200 mL/hr over 60 Minutes Intravenous  Once 03/20/18 0613 03/20/18 0959   03/20/18 0615  metroNIDAZOLE (FLAGYL) IVPB 500 mg     500 mg 100 mL/hr over 60 Minutes Intravenous  Once 03/20/18 0613 03/20/18 0837   03/20/18 0600  amoxicillin-clavulanate (AUGMENTIN) 875-125 MG per tablet 1 tablet  Status:  Discontinued     1 tablet Oral  Once 03/20/18 0548 03/20/18 0612       Assessment/Plan Diverticulitis of sigmoid colon - WBC trending down 14 from 18, afebrile - abdominal pain somewhat  improved, having bowel function - continue IV abx - mobilize as tolerated - Ok for sips of clears from the floor. Continue bowel regimen. Replace potassium. Repeat labs in AM.  FEN: ice chips/sips of clears, IVF VTE: SCDs, lovenox ID: cipro/flagyl 3/10; Zosyn 3/10>>   LOS: 2 days    Wellington Hampshire , [facility] Surgery 03/22/2018, 10:13 AM Pager: [phone] Mon-Thurs 7:00 am-4:30 pm Fri 7:00 am -11:30 AM Sat-Sun 7:00 am-11:30 am

## 2018-03-23 LAB — COMPREHENSIVE METABOLIC PANEL
ALBUMIN: 2.9 g/dL — AB (ref 3.5–5.0)
ALT: 20 U/L (ref 0–44)
AST: 17 U/L (ref 15–41)
Alkaline Phosphatase: 65 U/L (ref 38–126)
Anion gap: 10 (ref 5–15)
BUN: 10 mg/dL (ref 6–20)
CO2: 29 mmol/L (ref 22–32)
Calcium: 8.8 mg/dL — ABNORMAL LOW (ref 8.9–10.3)
Chloride: 93 mmol/L — ABNORMAL LOW (ref 98–111)
Creatinine, Ser: 1.02 mg/dL (ref 0.61–1.24)
GFR calc Af Amer: >60 mL/min (ref >60)
GFR calc non Af Amer: >60 mL/min (ref >60)
Glucose, Bld: 148 mg/dL — ABNORMAL HIGH (ref 70–99)
Potassium: 3.5 mmol/L (ref 3.5–5.1)
Sodium: 132 mmol/L — ABNORMAL LOW (ref 135–145)
Total Bilirubin: 1 mg/dL (ref 0.3–1.2)
Total Protein: 7 g/dL (ref 6.5–8.1)

## 2018-03-23 LAB — CBC
HCT: 40.6 % (ref 39.0–52.0)
Hemoglobin: 13.3 g/dL (ref 13.0–17.0)
MCH: 27.8 pg (ref 26.0–34.0)
MCHC: 32.8 g/dL (ref 30.0–36.0)
MCV: 84.8 fL (ref 80.0–100.0)
Platelets: 367 10*3/uL (ref 150–400)
RBC: 4.79 MIL/uL (ref 4.22–5.81)
RDW: 13.3 % (ref 11.5–15.5)
WBC: 13 10*3/uL — ABNORMAL HIGH (ref 4.0–10.5)
nRBC: 0 % (ref 0.0–0.2)

## 2018-03-23 LAB — MAGNESIUM: Magnesium: 1.9 mg/dL (ref 1.7–2.4)

## 2018-03-23 MED ORDER — BISMUTH SUBSALICYLATE 262 MG/15ML PO SUSP
30.0000 mL | ORAL | Status: DC | PRN
  Administered 2018-03-23: 30 mL via ORAL
  Filled 2018-03-23: qty 236

## 2018-03-23 MED ORDER — ALUM & MAG HYDROXIDE-SIMETH 200-200-20 MG/5ML PO SUSP
30.0000 mL | Freq: Four times a day (QID) | ORAL | Status: DC | PRN
  Administered 2018-03-23 – 2018-03-24 (×3): 30 mL via ORAL
  Filled 2018-03-23 (×3): qty 30

## 2018-03-23 NOTE — Progress Notes (Signed)
[facility] Progress Note     Subjective: CC: diverticulitis Patient reports one episode bilious emesis overnight. Feeling better now. Having bowel function. Feels the best he has felt so far. Abdominal pain still present but significantly improved.   Objective: Vital signs in last 24 hours: Temp:  [98.3 F (36.8 C)-98.9 F (37.2 C)] 98.7 F (37.1 C) (03/13 0529) Pulse Rate:  [72-84] 72 (03/13 0626) Resp:  [18-19] 19 (03/13 0529) BP: (142-176)/(83-115) 143/96 (03/13 0626) SpO2:  [96 %-98 %] 96 % (03/13 0529) Last BM Date: 03/22/18  Intake/Output from previous day: 03/12 0701 - 03/13 0700 In: 490 [P.O.:140; IV Piggyback:350] Out: 200 [Urine:200] Intake/Output this shift: No intake/output data recorded.  PE: Gen:  Alert, NAD, pleasant Card:  Regular rate and rhythm Pulm:  Normal effort, clear to auscultation bilaterally Abd: Soft, mildly TTP in RLQ, moderately distended, +BS Skin: warm and dry, no rashes  Psych: A&Ox3   Lab Results:  Recent Labs    03/22/18 0158 03/23/18 0407  WBC 14.0* 13.0*  HGB 11.5* 13.3  HCT 34.9* 40.6  PLT 260 367   BMET Recent Labs    03/22/18 0158 03/23/18 0407  NA 133* 132*  K 3.4* 3.5  CL 98 93*  CO2 25 29  GLUCOSE 135* 148*  BUN 7 10  CREATININE 0.94 1.02  CALCIUM 8.4* 8.8*   PT/INR No results for input(s): LABPROT, INR in the last 72 hours. CMP     Component Value Date/Time   NA 132 (L) 03/23/2018 0407   K 3.5 03/23/2018 0407   CL 93 (L) 03/23/2018 0407   CO2 29 03/23/2018 0407   GLUCOSE 148 (H) 03/23/2018 0407   BUN 10 03/23/2018 0407   CREATININE 1.02 03/23/2018 0407   CALCIUM 8.8 (L) 03/23/2018 0407   PROT 7.0 03/23/2018 0407   ALBUMIN 2.9 (L) 03/23/2018 0407   AST 17 03/23/2018 0407   ALT 20 03/23/2018 0407   ALKPHOS 65 03/23/2018 0407   BILITOT 1.0 03/23/2018 0407   GFRNONAA >60 03/23/2018 0407   GFRAA >60 03/23/2018 0407   Lipase     Component Value Date/Time   LIPASE 17 03/20/2018 0015        Studies/Results: No results found.  Anti-infectives: Anti-infectives (From admission, onward)   Start     Dose/Rate Route Frequency Ordered Stop   03/20/18 1400  piperacillin-tazobactam (ZOSYN) IVPB 3.375 g     3.375 g 12.5 mL/hr over 240 Minutes Intravenous Every 8 hours 03/20/18 0643     03/20/18 0715  piperacillin-tazobactam (ZOSYN) IVPB 3.375 g     3.375 g 100 mL/hr over 30 Minutes Intravenous  Once 03/20/18 0702 03/20/18 1037   03/20/18 0615  ciprofloxacin (CIPRO) IVPB 400 mg     400 mg 200 mL/hr over 60 Minutes Intravenous  Once 03/20/18 0613 03/20/18 0959   03/20/18 0615  metroNIDAZOLE (FLAGYL) IVPB 500 mg     500 mg 100 mL/hr over 60 Minutes Intravenous  Once 03/20/18 8527 03/20/18 0837   03/20/18 0600  amoxicillin-clavulanate (AUGMENTIN) 875-125 MG per tablet 1 tablet  Status:  Discontinued     1 tablet Oral  Once 03/20/18 0548 03/20/18 0612       Assessment/Plan Diverticulitis of sigmoid colon - WBC trending down, 13 today, afebrile - abdominal pain somewhat improved, having bowel function - continue IV abx - mobilize as tolerated - CLD  FEN: CLD, decrease IVF VTE: SCDs, lovenox ID: cipro/flagyl 3/10; Zosyn 3/10>>  LOS: 3 days  Brigid Re , [facility] Surgery 03/23/2018, 10:22 AM Pager: [phone] Consults: [phone]

## 2018-03-23 NOTE — Progress Notes (Signed)
Pt. had one episode of brownish green emesis. Administered PRN Zofran. Will continue to monitor.

## 2018-03-23 NOTE — Plan of Care (Signed)
  Problem: Education: °Goal: Knowledge of General Education information will improve °Description: Including pain rating scale, medication(s)/side effects and non-pharmacologic comfort measures °Outcome: Progressing °  °Problem: Health Behavior/Discharge Planning: °Goal: Ability to manage health-related needs will improve °Outcome: Progressing °  °Problem: Clinical Measurements: °Goal: Will remain free from infection °Outcome: Progressing °Goal: Diagnostic test results will improve °Outcome: Progressing °  °Problem: Activity: °Goal: Risk for activity intolerance will decrease °Outcome: Progressing °  °Problem: Nutrition: °Goal: Adequate nutrition will be maintained °Outcome: Progressing °  °Problem: Coping: °Goal: Level of anxiety will decrease °Outcome: Progressing °  °Problem: Elimination: °Goal: Will not experience complications related to bowel motility °Outcome: Progressing °Goal: Will not experience complications related to urinary retention °Outcome: Progressing °  °Problem: Pain Managment: °Goal: General experience of comfort will improve °Outcome: Progressing °  °Problem: Safety: °Goal: Ability to remain free from injury will improve °Outcome: Progressing °  °Problem: Skin Integrity: °Goal: Risk for impaired skin integrity will decrease °Outcome: Progressing °  °

## 2018-03-24 LAB — CBC
HCT: 41.2 % (ref 39.0–52.0)
Hemoglobin: 13.4 g/dL (ref 13.0–17.0)
MCH: 27.5 pg (ref 26.0–34.0)
MCHC: 32.5 g/dL (ref 30.0–36.0)
MCV: 84.4 fL (ref 80.0–100.0)
Platelets: 385 10*3/uL (ref 150–400)
RBC: 4.88 MIL/uL (ref 4.22–5.81)
RDW: 13.2 % (ref 11.5–15.5)
WBC: 12.2 10*3/uL — ABNORMAL HIGH (ref 4.0–10.5)
nRBC: 0 % (ref 0.0–0.2)

## 2018-03-24 LAB — COMPREHENSIVE METABOLIC PANEL
ALT: 23 U/L (ref 0–44)
AST: 17 U/L (ref 15–41)
Albumin: 2.6 g/dL — ABNORMAL LOW (ref 3.5–5.0)
Alkaline Phosphatase: 57 U/L (ref 38–126)
Anion gap: 10 (ref 5–15)
BUN: 13 mg/dL (ref 6–20)
CHLORIDE: 91 mmol/L — AB (ref 98–111)
CO2: 30 mmol/L (ref 22–32)
Calcium: 8.5 mg/dL — ABNORMAL LOW (ref 8.9–10.3)
Creatinine, Ser: 1.02 mg/dL (ref 0.61–1.24)
GFR calc Af Amer: >60 mL/min (ref >60)
GFR calc non Af Amer: >60 mL/min (ref >60)
Glucose, Bld: 152 mg/dL — ABNORMAL HIGH (ref 70–99)
Potassium: 3.8 mmol/L (ref 3.5–5.1)
SODIUM: 131 mmol/L — AB (ref 135–145)
Total Bilirubin: 0.8 mg/dL (ref 0.3–1.2)
Total Protein: 6.2 g/dL — ABNORMAL LOW (ref 6.5–8.1)

## 2018-03-24 MED ORDER — METOPROLOL TARTRATE 25 MG PO TABS
25.0000 mg | ORAL_TABLET | Freq: Two times a day (BID) | ORAL | Status: DC
  Administered 2018-03-24 – 2018-03-26 (×5): 25 mg via ORAL
  Filled 2018-03-24 (×5): qty 1

## 2018-03-24 MED ORDER — FAMOTIDINE 10 MG PO TABS
10.0000 mg | ORAL_TABLET | Freq: Every day | ORAL | Status: DC
  Administered 2018-03-24 – 2018-03-26 (×3): 10 mg via ORAL
  Filled 2018-03-24 (×3): qty 1

## 2018-03-24 MED ORDER — TRAMADOL HCL 50 MG PO TABS: 50.0000 mg | ORAL_TABLET | Freq: Four times a day (QID) | ORAL | Status: DC | PRN

## 2018-03-24 NOTE — Progress Notes (Signed)
[facility] Progress Note     Subjective: CC: heartburn Patient having bad heartburn, has struggled with this in the past too. Abdominal pain no longer constant and improving.   Objective: Vital signs in last 24 hours: Temp:  [97.5 F (36.4 C)-98.7 F (37.1 C)] 98.5 F (36.9 C) (03/14 0517) Pulse Rate:  [73-99] 75 (03/14 0642) Resp:  [17-18] 18 (03/14 0517) BP: (150-179)/(91-121) 152/97 (03/14 0642) SpO2:  [97 %-99 %] 99 % (03/14 0517) Last BM Date: 03/23/18  Intake/Output from previous day: 03/13 0701 - 03/14 0700 In: 421.2 [P.O.:270; IV Piggyback:151.2] Out: -  Intake/Output this shift: No intake/output data recorded.  PE: Gen:  Alert, NAD, pleasant Card:  Regular rate and rhythm Pulm:  Normal effort, clear to auscultation bilaterally Abd: Soft, mildly TTP in RLQ, moderately distended, +BS Skin: warm and dry, no rashes  Psych: A&Ox3   Lab Results:  Recent Labs    03/23/18 0407 03/24/18 0229  WBC 13.0* 12.2*  HGB 13.3 13.4  HCT 40.6 41.2  PLT 367 385   BMET Recent Labs    03/23/18 0407 03/24/18 0229  NA 132* 131*  K 3.5 3.8  CL 93* 91*  CO2 29 30  GLUCOSE 148* 152*  BUN 10 13  CREATININE 1.02 1.02  CALCIUM 8.8* 8.5*   PT/INR No results for input(s): LABPROT, INR in the last 72 hours. CMP     Component Value Date/Time   NA 131 (L) 03/24/2018 0229   K 3.8 03/24/2018 0229   CL 91 (L) 03/24/2018 0229   CO2 30 03/24/2018 0229   GLUCOSE 152 (H) 03/24/2018 0229   BUN 13 03/24/2018 0229   CREATININE 1.02 03/24/2018 0229   CALCIUM 8.5 (L) 03/24/2018 0229   PROT 6.2 (L) 03/24/2018 0229   ALBUMIN 2.6 (L) 03/24/2018 0229   AST 17 03/24/2018 0229   ALT 23 03/24/2018 0229   ALKPHOS 57 03/24/2018 0229   BILITOT 0.8 03/24/2018 0229   GFRNONAA >60 03/24/2018 0229   GFRAA >60 03/24/2018 0229   Lipase     Component Value Date/Time   LIPASE 17 03/20/2018 0015       Studies/Results: No results  found.  Anti-infectives: Anti-infectives (From admission, onward)   Start     Dose/Rate Route Frequency Ordered Stop   03/20/18 1400  piperacillin-tazobactam (ZOSYN) IVPB 3.375 g     3.375 g 12.5 mL/hr over 240 Minutes Intravenous Every 8 hours 03/20/18 0643     03/20/18 0715  piperacillin-tazobactam (ZOSYN) IVPB 3.375 g     3.375 g 100 mL/hr over 30 Minutes Intravenous  Once 03/20/18 0702 03/20/18 1037   03/20/18 0615  ciprofloxacin (CIPRO) IVPB 400 mg     400 mg 200 mL/hr over 60 Minutes Intravenous  Once 03/20/18 0102 03/20/18 0959   03/20/18 0615  metroNIDAZOLE (FLAGYL) IVPB 500 mg     500 mg 100 mL/hr over 60 Minutes Intravenous  Once 03/20/18 7253 03/20/18 0837   03/20/18 0600  amoxicillin-clavulanate (AUGMENTIN) 875-125 MG per tablet 1 tablet  Status:  Discontinued     1 tablet Oral  Once 03/20/18 0548 03/20/18 0612       Assessment/Plan Diverticulitis of sigmoid colon - WBCtrending down, 12 today, afebrile -abdominal pain somewhat improved, having bowel function - continue IV abx - will transition to PO abx tomorrow if tolerating FLD - mobilize as tolerated - advance to FLD  FEN: FLD VTE: SCDs, lovenox ID: cipro/flagyl 3/10; Zosyn 3/10>>  LOS: 4 days  Brigid Re , [facility] Surgery 03/24/2018, 10:18 AM Pager: [phone] Consults: [phone]

## 2018-03-25 LAB — CBC
HCT: 38.3 % — ABNORMAL LOW (ref 39.0–52.0)
Hemoglobin: 12.3 g/dL — ABNORMAL LOW (ref 13.0–17.0)
MCH: 26.8 pg (ref 26.0–34.0)
MCHC: 32.1 g/dL (ref 30.0–36.0)
MCV: 83.4 fL (ref 80.0–100.0)
Platelets: 318 10*3/uL (ref 150–400)
RBC: 4.59 MIL/uL (ref 4.22–5.81)
RDW: 13.3 % (ref 11.5–15.5)
WBC: 10.1 10*3/uL (ref 4.0–10.5)
nRBC: 0 % (ref 0.0–0.2)

## 2018-03-25 LAB — COMPREHENSIVE METABOLIC PANEL
ALK PHOS: 51 U/L (ref 38–126)
ALT: 27 U/L (ref 0–44)
AST: 26 U/L (ref 15–41)
Albumin: 2.8 g/dL — ABNORMAL LOW (ref 3.5–5.0)
Anion gap: 10 (ref 5–15)
BUN: 9 mg/dL (ref 6–20)
CO2: 28 mmol/L (ref 22–32)
Calcium: 8.5 mg/dL — ABNORMAL LOW (ref 8.9–10.3)
Chloride: 97 mmol/L — ABNORMAL LOW (ref 98–111)
Creatinine, Ser: 0.97 mg/dL (ref 0.61–1.24)
GFR calc Af Amer: >60 mL/min (ref >60)
GFR calc non Af Amer: >60 mL/min (ref >60)
Glucose, Bld: 102 mg/dL — ABNORMAL HIGH (ref 70–99)
Potassium: 3.6 mmol/L (ref 3.5–5.1)
Sodium: 135 mmol/L (ref 135–145)
Total Bilirubin: 0.9 mg/dL (ref 0.3–1.2)
Total Protein: 6.2 g/dL — ABNORMAL LOW (ref 6.5–8.1)

## 2018-03-25 NOTE — Progress Notes (Signed)
   Subjective/Chief Complaint: Having BM's Still feels bloated but less pain   Objective: Vital signs in last 24 hours: Temp:  [98.4 F (36.9 C)-99.1 F (37.3 C)] 98.9 F (37.2 C) (03/15 0450) Pulse Rate:  [77-82] 79 (03/15 0450) Resp:  [12-19] 19 (03/15 0450) BP: (147-156)/(88-97) 147/97 (03/15 0450) SpO2:  [97 %-98 %] 98 % (03/15 0450) Last BM Date: 03/23/18  Intake/Output from previous day: 03/14 0701 - 03/15 0700 In: 440 [P.O.:390; IV Piggyback:50] Out: -  Intake/Output this shift: No intake/output data recorded.  Exam: Abdomen softer, much less tender and less guarding  Lab Results:  Recent Labs    03/24/18 0229 03/25/18 0238  WBC 12.2* 10.1  HGB 13.4 12.3*  HCT 41.2 38.3*  PLT 385 318   BMET Recent Labs    03/24/18 0229 03/25/18 0238  NA 131* 135  K 3.8 3.6  CL 91* 97*  CO2 30 28  GLUCOSE 152* 102*  BUN 13 9  CREATININE 1.02 0.97  CALCIUM 8.5* 8.5*   PT/INR No results for input(s): LABPROT, INR in the last 72 hours. ABG No results for input(s): PHART, HCO3 in the last 72 hours.  Invalid input(s): PCO2, PO2  Studies/Results: No results found.  Anti-infectives: Anti-infectives (From admission, onward)   Start     Dose/Rate Route Frequency Ordered Stop   03/20/18 1400  piperacillin-tazobactam (ZOSYN) IVPB 3.375 g     3.375 g 12.5 mL/hr over 240 Minutes Intravenous Every 8 hours 03/20/18 0643     03/20/18 0715  piperacillin-tazobactam (ZOSYN) IVPB 3.375 g     3.375 g 100 mL/hr over 30 Minutes Intravenous  Once 03/20/18 0702 03/20/18 1037   03/20/18 0615  ciprofloxacin (CIPRO) IVPB 400 mg     400 mg 200 mL/hr over 60 Minutes Intravenous  Once 03/20/18 0613 03/20/18 0959   03/20/18 0615  metroNIDAZOLE (FLAGYL) IVPB 500 mg     500 mg 100 mL/hr over 60 Minutes Intravenous  Once 03/20/18 0613 03/20/18 0837   03/20/18 0600  amoxicillin-clavulanate (AUGMENTIN) 875-125 MG per tablet 1 tablet  Status:  Discontinued     1 tablet Oral  Once  03/20/18 0548 03/20/18 0612      Assessment/Plan: Diverticulitis  WBC now normal.  Clinically improving Keep on IV antibiotics Try full liquids   LOS: 5 days    Coralie Keens 03/25/2018

## 2018-03-26 MED ORDER — AMOXICILLIN-POT CLAVULANATE 875-125 MG PO TABS: 1.0000 | ORAL_TABLET | Freq: Two times a day (BID) | ORAL | 0 refills | Status: AC

## 2018-03-26 NOTE — TOC Progression Note (Signed)
Transition of Care [facility]) - Progression Note    Patient Details  Name: Jeff Dillon MRN: [id number] Date of Birth: [birth year]  [facility] CM/SW Contact  Jacalyn Lefevre Edson Snowball, RN Phone Number: 03/26/2018, 12:30 PM  Clinical Narrative:      Provided [place] letter with Co pay over ride for Augmentin . Patient has clinic information Expected Discharge Plan: Home/Self Care Barriers to Discharge: No Barriers Identified  Expected Discharge Plan and Services Expected Discharge Plan: Home/Self Care Discharge Planning Services: CM Consult, Medication Assistance, [place] [facility], [facility] Post Acute Care Choice: NA Living arrangements for the past 2 months: Single Family Home Expected Discharge Date: 03/26/18               DME Arranged: N/A DME Agency: NA HH Arranged: NA HH Agency: NA   Social Determinants of Health (SDOH) Interventions    Readmission Risk Interventions 30 Day Unplanned Readmission Risk Score     ED to Hosp-Admission (Current) from 03/20/2018 in [place]  30 Day Unplanned Readmission Risk Score (%)  10 Filed at 03/26/2018 1200     This score is the patient's risk of an unplanned readmission within 30 days of being discharged (0 -100%). The score is based on dignosis, age, lab data, medications, orders, and past utilization.   Low:  0-14.9   Medium: 15-21.9   High: 22-29.9   Extreme: 30 and above       No flowsheet data found.

## 2018-03-26 NOTE — Discharge Summary (Signed)
[facility] Discharge Summary   Patient ID: Jeff Dillon MRN: [id number] DOB/AGE: 57-21-1962 57 y.o.  Admit date: 03/20/2018 Discharge date: 03/26/2018  Admitting Diagnosis: Diverticulitis of sigmoid colon   Discharge Diagnosis Diverticulitis of sigmoid colon   Consultants None  Imaging: No results found.  Procedures None  Hospital Course:  Patient is a 58 year old male who [facility] to [facility] with abdominal pain.  Workup showed diverticulitis.  Patient was admitted and treated conservatively with bowel rest and IV antibiotics. Diet was advanced as tolerated with clinical improvement.   On 03/26/18, the patient was voiding well, tolerating diet, ambulating well, pain well controlled, vital signs stable and felt stable for discharge home.  Patient will follow up in our office in 6-8 weeks and knows to call with questions or concerns.  He will call to confirm appointment date/time.    Physical Exam: Gen: Alert, NAD, pleasant Card: Regular rate and rhythm Pulm: Normal effort, clear to auscultation bilaterally Abd: Soft,non-tender,non-distended,+BS Skin: warm and dry, no rashes  Psych: A&Ox3   Allergies as of 03/26/2018      Reactions   Codeine Nausea Only      Medication List    TAKE these medications   acetaminophen 500 MG tablet Commonly known as:  TYLENOL Take 1,000 mg by mouth every 6 (six) hours as needed for mild pain.   amoxicillin-clavulanate 875-125 MG tablet Commonly known as:  Augmentin Take 1 tablet by mouth every 12 (twelve) hours for 14 days.        [place] Schedule an appointment as soon as possible for a visit.   Specialty:  Family Medicine Why:  Follow up regarding high blood pressure Contact information: [place] [address] [phone]        Gastroenterology, [place] [place] Follow up.   Why:  Call and schedule colonoscopy for 6-8 weeks from discharge.   Contact information: [place] [address] [phone]        Leighton Ruff, MD. Go on 0/76/2263.   Specialty:  General Surgery Why:  Follow up appointment for 10:30 AM. Please arrive 30 min prior to appointment time. Bring photo ID and insurance information. Contact information: [place]  [id number] [phone]           Signed: Brigid Re, [facility] 03/26/2018, 3:08 PM Pager: [phone] Consults: [phone]

## 2018-03-26 NOTE — Discharge Instructions (Signed)
Follow Low fiber diet for 4-6 weeks then transition to high fiber diet   Diverticulitis  Diverticulitis is when small pockets in your large intestine (colon) get infected or swollen. This causes stomach pain and watery poop (diarrhea). These pouches are called diverticula. They form in people who have a condition called diverticulosis. Follow these instructions at home: Medicines  Take over-the-counter and prescription medicines only as told by your doctor. These include: ? Antibiotics. ? Pain medicines. ? Fiber pills. ? Probiotics. ? Stool softeners.  Do not drive or use heavy machinery while taking prescription pain medicine.  If you were prescribed an antibiotic, take it as told. Do not stop taking it even if you feel better. General instructions   Follow a diet as told by your doctor.  When you feel better, your doctor may tell you to change your diet. You may need to eat a lot of fiber. Fiber makes it easier to poop (have bowel movements). Healthy foods with fiber include: ? Berries. ? Beans. ? Lentils. ? Green vegetables.  Exercise 3 or more times a week. Aim for 30 minutes each time. Exercise enough to sweat and make your heart beat faster.  Keep all follow-up visits as told. This is important. You may need to have an exam of the large intestine. This is called a colonoscopy. Contact a doctor if:  Your pain does not get better.  You have a hard time eating or drinking.  You are not pooping like normal. Get help right away if:  Your pain gets worse.  Your problems do not get better.  Your problems get worse very fast.  You have a fever.  You throw up (vomit) more than one time.  You have poop that is: ? Bloody. ? Black. ? Tarry. Summary  Diverticulitis is when small pockets in your large intestine (colon) get infected or swollen.  Take medicines only as told by your doctor.  Follow a diet as told by your doctor. This information is not intended to  replace advice given to you by your health care provider. Make sure you discuss any questions you have with your health care provider. Document Released: 06/15/2007 Document Revised: 01/14/2016 Document Reviewed: 01/14/2016 Elsevier Interactive Patient Education  2019 [employer]   High-Fiber Diet Fiber, also called dietary fiber, is a type of carbohydrate that is found in fruits, vegetables, whole grains, and beans. A high-fiber diet can have many health benefits. Your health care provider may recommend a high-fiber diet to help:  Prevent constipation. Fiber can make your bowel movements more regular.  Lower your cholesterol.  Relieve the following conditions: ? Swelling of veins in the anus (hemorrhoids). ? Swelling and irritation (inflammation) of specific areas of the digestive tract (uncomplicated diverticulosis). ? A problem of the large intestine (colon) that sometimes causes pain and diarrhea (irritable bowel syndrome, IBS).  Prevent overeating as part of a weight-loss plan.  Prevent heart disease, type 2 diabetes, and certain cancers. What is my plan? The recommended daily fiber intake in grams (g) includes:  38 g for men age 70 or younger.  30 g for men over age 4.  41 g for women age 46 or younger.  21 g for women over age 77. You can get the recommended daily intake of dietary fiber by:  Eating a variety of fruits, vegetables, grains, and beans.  Taking a fiber supplement, if it is not possible to get enough fiber through your diet. What do I need to know  about a high-fiber diet?  It is better to get fiber through food sources rather than from fiber supplements. There is not a lot of research about how effective supplements are.  Always check the fiber content on the nutrition facts label of any prepackaged food. Look for foods that contain 5 g of fiber or more per serving.  Talk with a diet and nutrition specialist (dietitian) if you have questions about  specific foods that are recommended or not recommended for your medical condition, especially if those foods are not listed below.  Gradually increase how much fiber you consume. If you increase your intake of dietary fiber too quickly, you may have bloating, cramping, or gas.  Drink plenty of water. Water helps you to digest fiber. What are tips for following this plan?  Eat a wide variety of high-fiber foods.  Make sure that half of the grains that you eat each day are whole grains.  Eat breads and cereals that are made with whole-grain flour instead of refined flour or white flour.  Eat brown rice, bulgur wheat, or millet instead of white rice.  Start the day with a breakfast that is high in fiber, such as a cereal that contains 5 g of fiber or more per serving.  Use beans in place of meat in soups, salads, and pasta dishes.  Eat high-fiber snacks, such as berries, raw vegetables, nuts, and popcorn.  Choose whole fruits and vegetables instead of processed forms like juice or sauce. What foods can I eat?  Fruits Berries. Pears. Apples. Oranges. Avocado. Prunes and raisins. Dried figs. Vegetables Sweet potatoes. Spinach. Kale. Artichokes. Cabbage. Broccoli. Cauliflower. Green peas. Carrots. Squash. Grains Whole-grain breads. Multigrain cereal. Oats and oatmeal. Brown rice. Barley. Bulgur wheat. Media. Quinoa. Bran muffins. Popcorn. Rye wafer crackers. Meats and other proteins Navy, kidney, and pinto beans. Soybeans. Split peas. Lentils. Nuts and seeds. Dairy Fiber-fortified yogurt. Beverages Fiber-fortified soy milk. Fiber-fortified orange juice. Other foods Fiber bars. The items listed above may not be a complete list of recommended foods and beverages. Contact a dietitian for more options. What foods are not recommended? Fruits Fruit juice. Cooked, strained fruit. Vegetables Fried potatoes. Canned vegetables. Well-cooked vegetables. Grains White bread. Pasta made with  refined flour. White rice. Meats and other proteins Fatty cuts of meat. Fried chicken or fried fish. Dairy Milk. Yogurt. Cream cheese. Sour cream. Fats and oils Butters. Beverages Soft drinks. Other foods Cakes and pastries. The items listed above may not be a complete list of foods and beverages to avoid. Contact a dietitian for more information. Summary  Fiber is a type of carbohydrate. It is found in fruits, vegetables, whole grains, and beans.  There are many health benefits of eating a high-fiber diet, such as preventing constipation, lowering blood cholesterol, helping with weight loss, and reducing your risk of heart disease, diabetes, and certain cancers.  Gradually increase your intake of fiber. Increasing too fast can result in cramping, bloating, and gas. Drink plenty of water while you increase your fiber.  The best sources of fiber include whole fruits and vegetables, whole grains, nuts, seeds, and beans. This information is not intended to replace advice given to you by your health care provider. Make sure you discuss any questions you have with your health care provider. Document Released: 12/27/2004 Document Revised: 10/31/2016 Document Reviewed: 10/31/2016 Elsevier Interactive Patient Education  2019 [employer]  Low-Fiber Eating Plan Fiber is found in fruits, vegetables, whole grains, and beans. Eating a diet low in  fiber helps to reduce how often you have bowel movements and how much you produce during a bowel movement. A low-fiber eating plan may help your digestive system heal if:  You have certain conditions, such as Crohn's disease or diverticulitis.  You recently had radiation therapy on your pelvis or bowel.  You recently had intestinal surgery.  You have a new surgical opening in your abdomen (colostomy or ileostomy).  Your intestine is narrowed (stricture). Your health care provider will determine how long you need to stay on this diet. Your health  care provider may recommend that you work with a diet and nutrition specialist (dietitian). What are tips for following this plan? General guidelines  Follow recommendations from your dietitian about how much fiber you should have each day.  Most people on this eating plan should try to eat less than 10 grams (g) of fiber each day. Your daily fiber goal is _________________ g.  Take vitamin and mineral supplements as told by your health care provider or dietitian. Chewable or liquid forms are best when on this eating plan. Reading food labels  Check food labels for the amount of dietary fiber.  Choose foods that have less than 2 grams of fiber in one serving. Cooking  Use white flour and other allowed grains for baking and cooking.  Cook meat using methods that keep it tender, such as braising or poaching.  Cook eggs until the yolk is completely solid.  Cook with healthy oils, such as olive oil or canola oil. Meal planning   Eat 5-6 small meals throughout the day instead of 3 large meals.  If you are lactose intolerant: ? Choose low-lactose dairy foods. ? Do not eat dairy foods, if told by your dietitian.  Limit fat and oils to less than 8 teaspoons a day.  Eat small portions of desserts. What foods are allowed? The items listed below may not be a complete list. Talk with your dietitian about what dietary choices are best for you. Grains All bread and crackers made with white flour. Waffles, pancakes, and [place] toast. Bagels. Pretzels. Melba toast, zwieback, and matzoh. Cooked and dried cereals that do not contain whole grains, added fiber, seeds, or dried fruit. CornmealDomenick Gong. Hot and cold cereals made with refined corn, wheat, rice, or oats. Plain pasta and noodles. White rice. Vegetables Well-cooked or canned vegetables without skin, seeds, or stems. Cooked potatoes without skins. Vegetable juice. Fruits Soft-cooked or canned fruits without skin and seeds. Peeled ripe  banana. Applesauce. Fruit juice without pulp. Meats and other protein foods Ground meat. Tender cuts of meat or poultry. Eggs. Fish, seafood, and shellfish. Smooth nut butters. Tofu. Dairy All milk products and drinks. Lactose-free milks, including rice, soy, and almond milks. Yogurt without fruit, nuts, chocolate, or granola mix-ins. Sour cream. Cottage cheese. Cheese. Beverages Decaf coffee. Fruit and vegetable juices or smoothies (in small amounts, with no pulp or skins, and with fruits from allowed list). Sports drinks. Herbal tea. Fats and oils Olive oil, canola oil, sunflower oil, flaxseed oil, and grapeseed oil. Mayonnaise. Cream cheese. Margarine. Butter. Sweets and desserts Plain cakes and cookies. Cream pies and pies made with allowed fruits. Pudding. Custard. Fruit gelatin. Sherbet. Popsicles. Ice cream without nuts. Plain hard candy. Honey. Jelly. Molasses. Syrups, including chocolate syrup. Chocolate. Marshmallows. Gumdrops. Seasoning and other foods Bouillon. Broth. Cream soups made from allowed foods. Strained soup. Casseroles made with allowed foods. Ketchup. Mild mustard. Mild salad dressings. Plain gravies. Vinegar. Spices in moderation. Salt. Sugar.  What foods are not allowed? The items listed below may not be a complete list. Talk with your dietitian about what dietary choices are best for you. Grains Whole wheat and whole grain breads and crackers. Multigrain breads and crackers. Rye bread. Whole grain or multigrain cereals. Cereals with nuts, raisins, or coconut. Bran. Coarse wheat cereals. Granola. High-fiber cereals. Cornmeal or corn bread. Whole grain pasta. Wild or brown rice. Quinoa. Popcorn. Buckwheat. Wheat germ. Vegetables Potato skins. Raw or undercooked vegetables. All beans and bean sprouts. Cooked greens. Corn. Peas. Cabbage. Beets. Broccoli. Brussels sprouts. Cauliflower. Mushrooms. Onions. Peppers. Parsnips. Okra. Sauerkraut. Fruit Raw or dried fruit. Berries.  Fruit juice with pulp. Prune juice. Meats and other protein foods Tough, fibrous meats with gristle. Fatty meat. Poultry with skin. Fried meat, Sales executive, or fish. Deli or lunch meats. Sausage, bacon, and hot dogs. Nuts and chunky nut butter. Dried peas, beans, and lentils. Dairy Yogurt with fruit, nuts, chocolate, or granola mix-ins. Beverages Caffeinated coffee and teas. Fats and oils Avocado. Coconut. Sweets and desserts Desserts, cookies, or candies that contain nuts or coconut. Dried fruit. Jams and preserves with seeds. Marmalade. Any dessert made with fruits or grains that are not allowed. Seasoning and other foods Corn tortilla chips. Soups made with vegetables or grains that are not allowed. Relish. Horseradish. Angie Fava. Olives. Summary  Most people on a low-fiber eating plan should eat less than 10 grams of fiber a day. Follow recommendations from your dietitian about how much fiber you should have each day.  Always check food labels to see the dietary fiber content of packaged foods. In general, a low-fiber food will have fewer than 2 grams of fiber per serving.  In general, try to avoid whole grains, raw fruits and vegetables, dried fruit, tough cuts of meat, nuts, and seeds.  Take a vitamin and mineral supplement as told by your health care provider or dietitian. This information is not intended to replace advice given to you by your health care provider. Make sure you discuss any questions you have with your health care provider. Document Released: 06/18/2001 Document Revised: 03/01/2016 Document Reviewed: 03/01/2016 Elsevier Interactive Patient Education  2019 [employer].

## 2020-11-29 IMAGING — CT CT ABDOMEN AND PELVIS WITH CONTRAST
2 of 6 series · 16 of 46 positions shown, 18 images · IV contrast (omnipaque)
Comparison: None.

CLINICAL DATA: Lower abdominal pain radiating to testicles. Assess
for hernia or appendectomy.

EXAM:
CT ABDOMEN AND PELVIS WITH CONTRAST
TECHNIQUE: Multidetector CT imaging of the abdomen and pelvis was performed
using the standard protocol following bolus administration of
intravenous contrast.
CONTRAST:  100mL OMNIPAQUE IOHEXOL 300 MG/ML  SOLN

[Series 3: abdomen 5.0 · axial · 0.94mm/px · z∈[+801,+1291]mm · 13 of 112 slices shown, 15 images]
[im 7/112  soft-tissue]
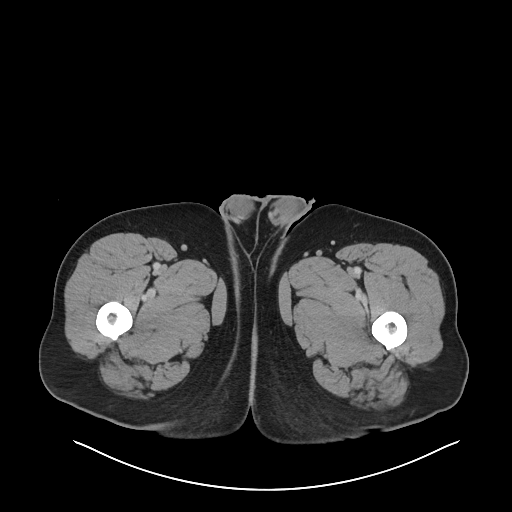
[im 7/112  bone]
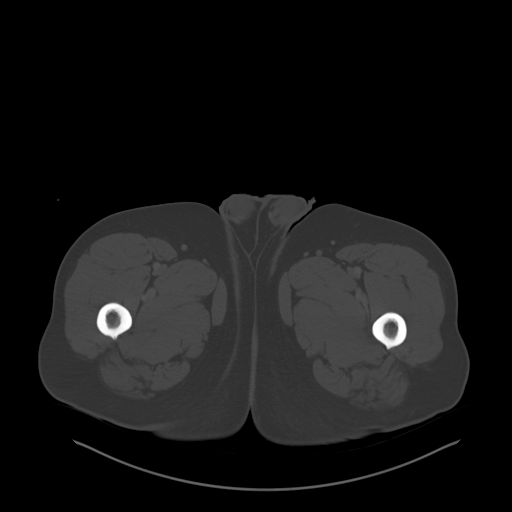
[im 14/112  soft-tissue]
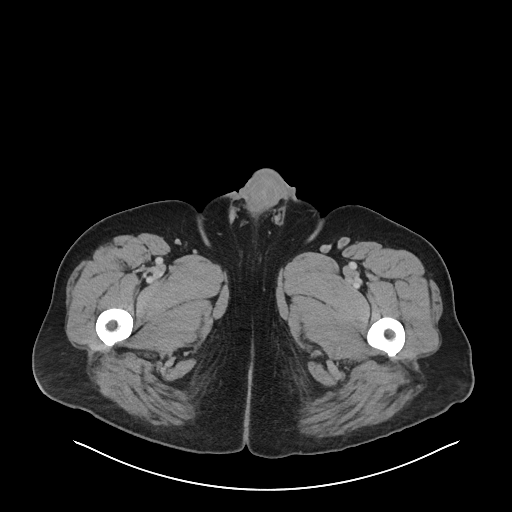
[im 21/112  soft-tissue]
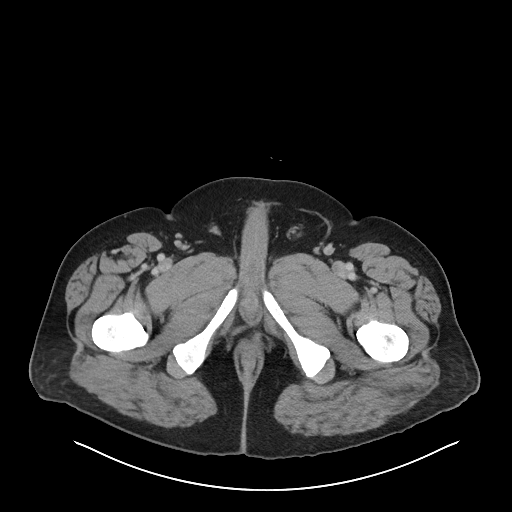
[im 35/112  soft-tissue]
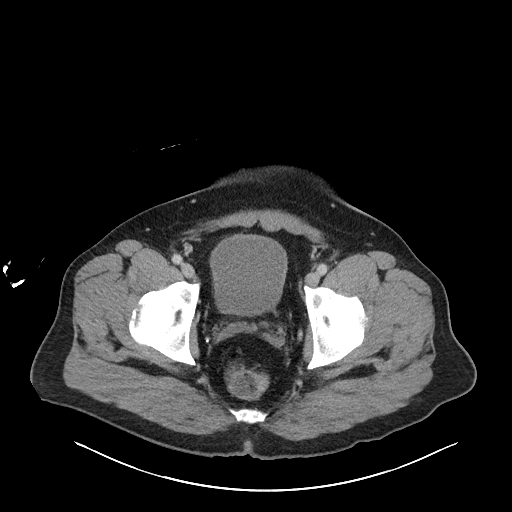
[im 42/112  soft-tissue]
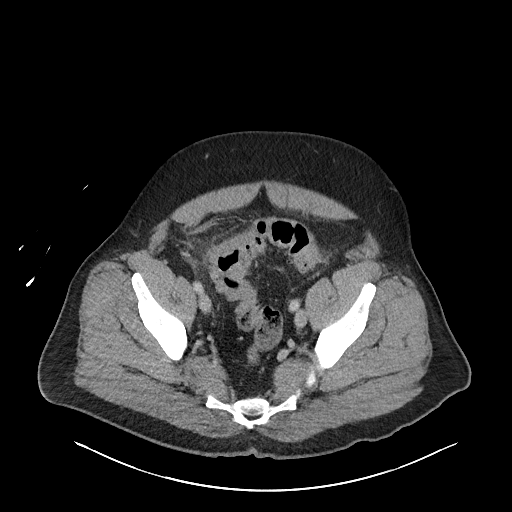
[im 49/112  soft-tissue]
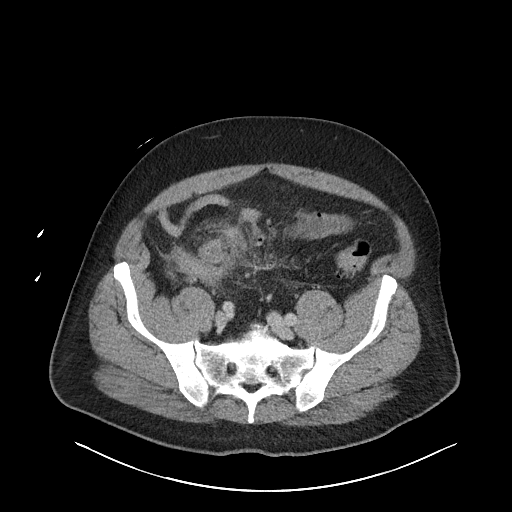
[im 56/112  soft-tissue]
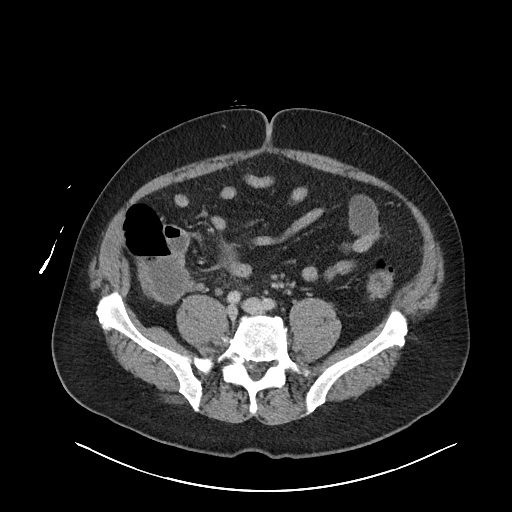
[im 63/112  soft-tissue]
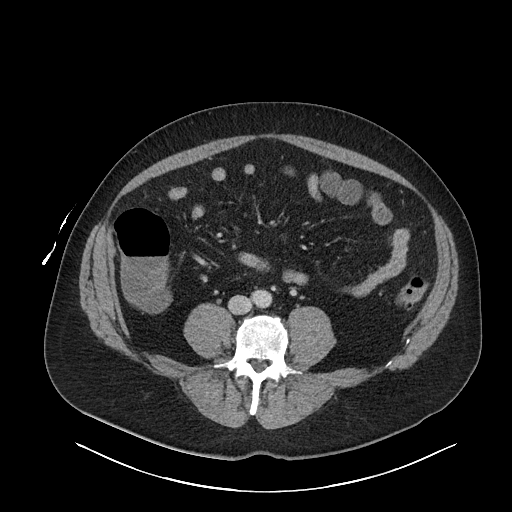
[im 70/112  soft-tissue]
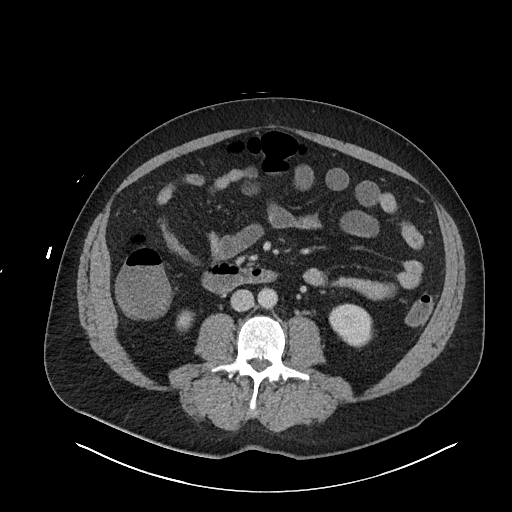
[im 70/112  bone]
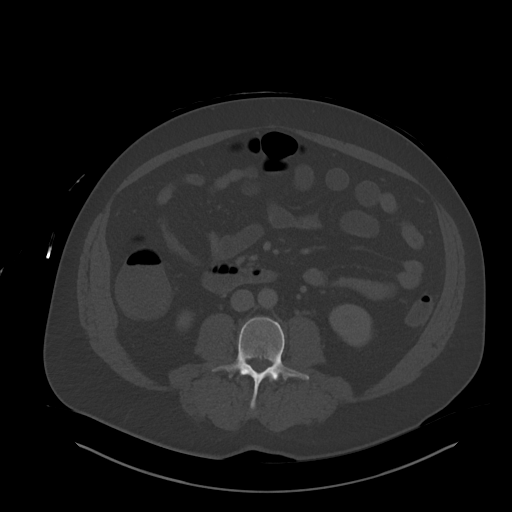
[im 77/112  soft-tissue]
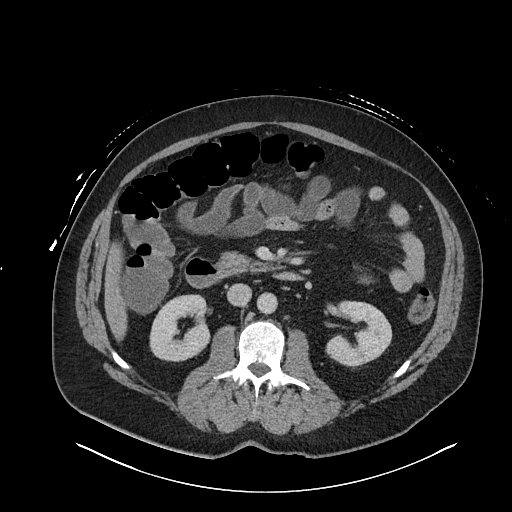
[im 91/112  soft-tissue]
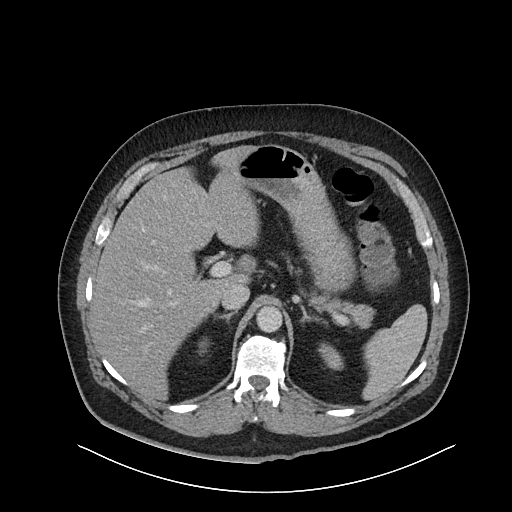
[im 98/112  soft-tissue]
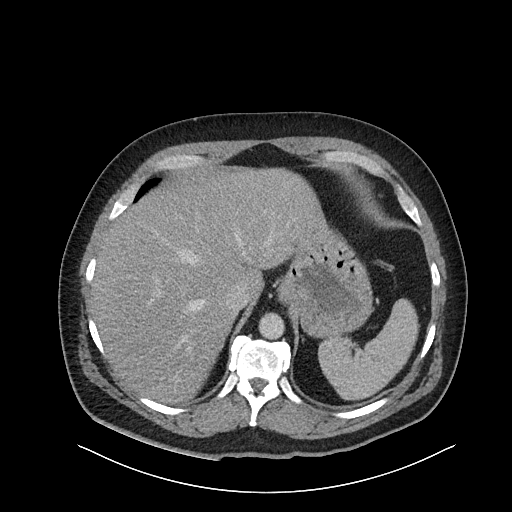
[im 105/112  soft-tissue]
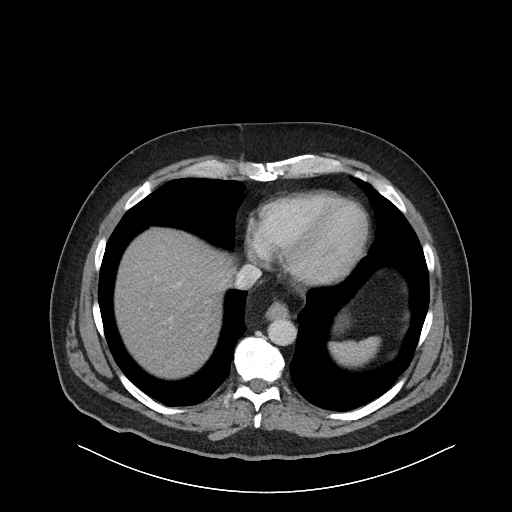

[Series 6: abdomen 3.0 mpr cor · coronal · 0.93mm/px · 3 of 121 slices shown]
[im 41/121  soft-tissue]
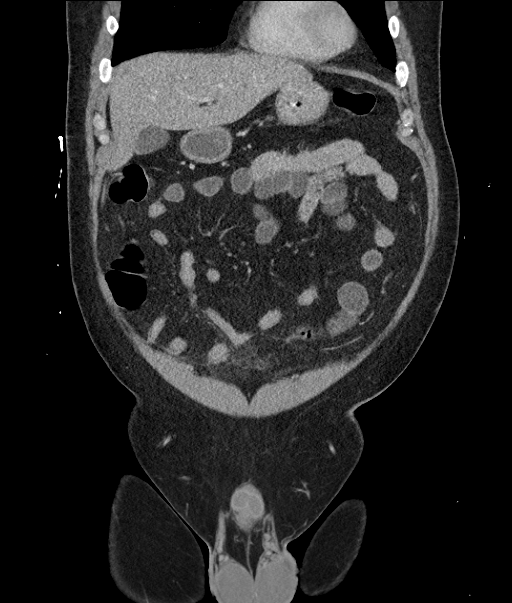
[im 54/121  soft-tissue]
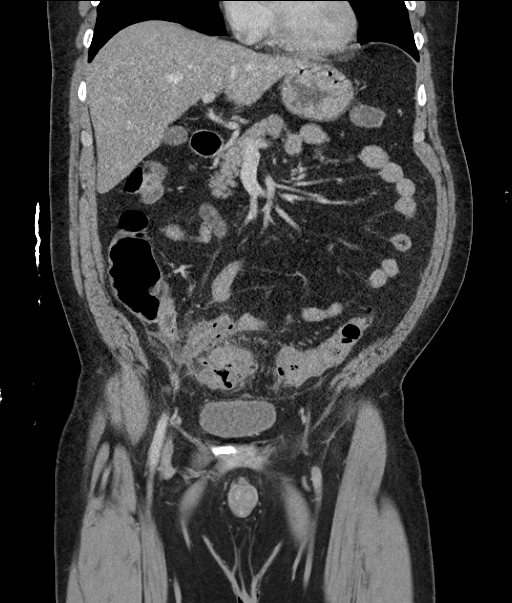
[im 67/121  soft-tissue]
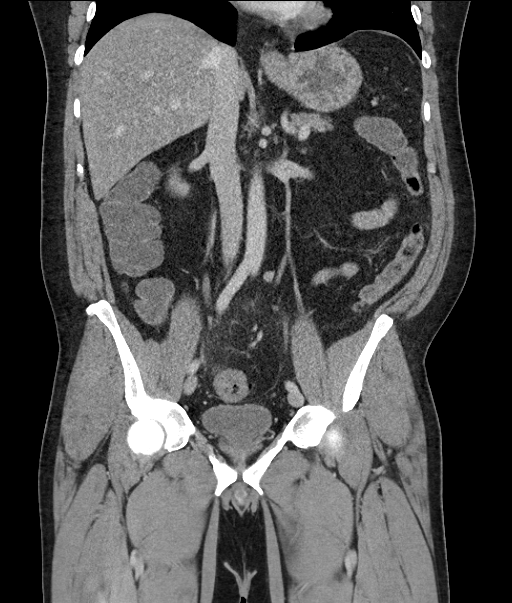

[16 of 46 positions shown; findings below may reference images not displayed]

FINDINGS: LOWER CHEST: Lung bases are clear. Included heart size is normal. No
pericardial effusion. Fat containing paraesophageal hernia.

HEPATOBILIARY: Liver and gallbladder are normal.

PANCREAS: Normal.

SPLEEN: Normal.

ADRENALS/URINARY TRACT: Kidneys are orthotopic, demonstrating
symmetric enhancement. No nephrolithiasis, hydronephrosis or solid
renal masses. Too small to characterize hypodensity LEFT interpolar
kidney. The unopacified ureters are normal in course and caliber.
Delayed imaging through the kidneys demonstrates symmetric prompt
contrast excretion within the proximal urinary collecting system.
Urinary bladder is partially distended and unremarkable. Normal
adrenal glands.

STOMACH/BOWEL: Moderate sigmoid colonic diverticulosis with
superimposed short segment of sigmoid colonic bowel wall thickening
and pericolonic fat stranding. Contained microperforation without
abscess. The adjacent terminal ileum is inflamed with surrounding
fat stranding. Normal appendix. Small and large bowel normal course
and caliber.

VASCULAR/LYMPHATIC: Aortoiliac vessels are normal in course and
caliber. No lymphadenopathy by CT size criteria.

REPRODUCTIVE: Normal.

OTHER: No intraperitoneal free fluid or free air.

MUSCULOSKELETAL: Nonacute. Small bilateral fat containing inguinal
hernias. Severe L5-S1 degenerative disc.
IMPRESSION: 1. Acute sigmoid colonic diverticulitis. Adjacent terminal ileal
inflammation is likely reactive. No abscess or obstruction.
